# Patient Record
Sex: Female | Born: 1990 | Hispanic: Yes | Marital: Single
Health system: Southern US, Community
[De-identification: ages and names within clinical notes are randomized; demographics above are authoritative.]

## PROBLEM LIST (undated history)

## (undated) DIAGNOSIS — F419 Anxiety disorder, unspecified: Secondary | ICD-10-CM

---

## 2016-06-12 ENCOUNTER — Emergency Department (HOSPITAL_COMMUNITY)
Admission: EM | Admit: 2016-06-12 | Discharge: 2016-06-12 | Disposition: A | Discharge status: Home / Self Care | Payer: Self-pay | Attending: Emergency Medicine | Admitting: Emergency Medicine

## 2016-06-12 ENCOUNTER — Emergency Department (HOSPITAL_COMMUNITY): Payer: Self-pay

## 2016-06-12 ENCOUNTER — Encounter (HOSPITAL_COMMUNITY): Payer: Self-pay | Admitting: Emergency Medicine

## 2016-06-12 DIAGNOSIS — F419 Anxiety disorder, unspecified: Secondary | ICD-10-CM | POA: Insufficient documentation

## 2016-06-12 DIAGNOSIS — R079 Chest pain, unspecified: Secondary | ICD-10-CM | POA: Insufficient documentation

## 2016-06-12 HISTORY — DX: Anxiety disorder, unspecified: F41.9

## 2016-06-12 LAB — I-STAT BETA HCG BLOOD, ED (MC, WL, AP ONLY): I-stat hCG, quantitative: <5 m[IU]/mL (ref <5)

## 2016-06-12 LAB — I-STAT TROPONIN, ED: TROPONIN I, POC: 0 ng/mL (ref 0.00–0.08)

## 2016-06-12 MED ORDER — KETOROLAC TROMETHAMINE 60 MG/2ML IM SOLN
60.0000 mg | Freq: Once | INTRAMUSCULAR | Status: AC
  Administered 2016-06-12: 60 mg via INTRAMUSCULAR
  Filled 2016-06-12: qty 2

## 2016-06-12 NOTE — ED Notes (Signed)
Patient transported to X-ray 

## 2016-06-12 NOTE — Discharge Instructions (Signed)
I think her symptoms today were caused by anxiety. Follow-up with the doctors listed below for further evaluation.  You can take Tylenol or ibuprofen as needed for the pain.  Return to the emergency department for any worsening pain, difficulty breathing, feelings of wanting to hurt or kill yourself or any other worsening or concerning symptoms.   If you do not have a primary care doctor you see regularly, please you the list below. Please call them to arrange for follow-up.    No Primary Care Doctor Call [facility]  [phone] Other agencies that provide inexpensive medical care    [facility]  [phone]    [facility]  [phone]    [facility]  [phone]    [facility]  [phone]    [facility]  [phone]    [facility]  [phone]

## 2016-06-12 NOTE — ED Triage Notes (Signed)
Per EMS pt complaint of panic attack at work; hx of same.

## 2016-06-12 NOTE — ED Provider Notes (Signed)
[facility]-EMERGENCY DEPT Provider Note   CSN: [id number] Arrival date & time: 06/12/16  1729  By signing my name below, I, Ny'Kea Lewis, attest that this documentation has been prepared under the direction and in the presence of Graciella Freer, PA-C. Electronically Signed: Karren Cobble, ED Scribe. 06/13/16. 2:26 AM.  History   Chief Complaint Chief Complaint  Patient presents with  . Anxiety   The history is provided by the patient. A language interpreter was used.   HPI Comments: Heather Sutton is a 26 y.o. female with a PMHx of anxiety, brought in by ambulance, who presents to the Emergency Department complaining of chest tightness, difficulty breathing and paresthesia associated with increased anxiety that started at 4:30 PM. Patient states that symptoms began after she was told some very stressful news. Patient states that when she was initially very upset and proceeded to have symptoms. She states that she had a difficult time catching her breath and then proceeded to have paresthesias. Patient states that she has had similar episodes where she has hyperventilated and had chest tightness after receiving stressful news. On ED arrival patient states that she has had improvement of symptoms. Patient states that the difficulty breathing and the paresthesias have completely resolved. She still has some residual chest status but states that is improved. She currently reports chest tightness as 3/10. Pt states she has recently been lifting heavy boxes but denies any recent chest injuries. Not currently on birth control. No recent surgery, immobilization, recent long travel, hx of pe/dvt. Denies tobbaco or cocaine/marijuana/heroin use.. Denies fever, cough, peripheral edema, shortness of breath, headache,  abdominal pain, nausea, vomiting, dysuria, sucidal or homicial ideations.   interpreter : [id number]  Past Medical History:  Diagnosis Date  . Anxiety     There are no active problems to display  for this patient.   History reviewed. No pertinent surgical history.  OB History    No data available       Home Medications    Prior to Admission medications   Not on File    Family History No family history on file.  Social History Social History  Substance Use Topics  . Smoking status: Not on file  . Smokeless tobacco: Not on file  . Alcohol use Not on file     Allergies   Patient has no known allergies.   Review of Systems Review of Systems  Constitutional: Negative for fever.  Respiratory: Negative for cough and shortness of breath.   Cardiovascular: Positive for chest pain. Negative for leg swelling.  Gastrointestinal: Negative for abdominal pain, nausea and vomiting.  Genitourinary: Negative for dysuria and hematuria.  Neurological: Negative for headaches.  Psychiatric/Behavioral: Negative for suicidal ideas.       Denies homicidal ideations.   All other systems reviewed and are negative.    Physical Exam Updated Vital Signs BP 99/65   Pulse (!) 59   Temp 97.5 F (36.4 C) (Oral)   Resp 10   LMP 06/07/2016   SpO2 98%   Physical Exam  Constitutional: She appears well-developed and well-nourished.  Appears anxious but sitting comfortably on examination table in no acute distress.  HENT:  Head: Normocephalic and atraumatic.  Eyes: Conjunctivae and EOM are normal. Pupils are equal, round, and reactive to light. Right eye exhibits no discharge. Left eye exhibits no discharge. No scleral icterus.  Cardiovascular: Normal rate and regular rhythm.  Exam reveals no gallop and no friction rub.   No murmur heard. Pulses:  Radial pulses are 2+ on the right side, and 2+ on the left side.  Pulmonary/Chest: Effort normal and breath sounds normal. No accessory muscle usage. No respiratory distress. She exhibits tenderness.  Tender with palpation to the left anterior chest wall. Pain is reproduced with adduction/abduction of left upper extremity. No  deformities, no crepitus. No evidence of respiratory distress. Able to speak in full sentences without difficulty.  Musculoskeletal: She exhibits no deformity.  Neurological: She is alert.  Skin: Skin is warm and dry.  Psychiatric: She has a normal mood and affect. Her speech is normal and behavior is normal.  Nursing note and vitals reviewed.   ED Treatments / Results  DIAGNOSTIC STUDIES: Oxygen Saturation is 100% on RA, normal by my interpretation.   COORDINATION OF CARE: 9:01 PM-Discussed next steps with pt. Pt verbalized understanding and is agreeable with the plan.   Labs (all labs ordered are listed, but only abnormal results are displayed) Labs Reviewed  Rosezena Sensor, ED  I-STAT BETA HCG BLOOD, ED ([facility], [facility], [facility] ONLY)    EKG  EKG Interpretation None       Radiology Dg Chest 2 View  Result Date: 06/12/2016 CLINICAL DATA:  Chest pain EXAM: CHEST  2 VIEW COMPARISON:  None. FINDINGS: The heart size and mediastinal contours are within normal limits. Both lungs are clear. The visualized skeletal structures are unremarkable. IMPRESSION: No active cardiopulmonary disease. Electronically Signed   By: Jasmine Pang M.D.   On: 06/12/2016 22:02    Procedures Procedures (including critical care time)  Medications Ordered in ED Medications  ketorolac (TORADOL) injection 60 mg (60 mg Intramuscular Given 06/12/16 2235)     Initial Impression / Assessment and Plan / ED Course  I have reviewed the triage vital signs and the nursing notes.  Pertinent labs & imaging results that were available during my care of the patient were reviewed by me and considered in my medical decision making (see chart for details).     26 year old female with past medical history of anxiety who presents today with increased anxiety and secondary chest tightness, difficulty breathing, paresthesias. Symptoms began after she reported hearing some very stressful news that upset her. On ED arrival she  states the difficulty breathing and paresthesias have resolved. She is still experiencing some mild chest tightness/pain. Patient is afebrile, non-toxic appearing, sitting comfortably on examination table. Symptoms likely result of a panic attack secondary to increased anxiety. Also consider muscular strain given recent history of lifting heavy objects. Low suspicion for ACS pathology given history/physical exam. History/physical exam are not concerning for a PE. Per negative PERC criteria, patient is low risk for a PE and does not require further testing at this time. Low suspicion for acute infectious etiology as cause of her symptoms. Will plan to check chest x-ray, EKG, troponin to evaluate for any acute process. Also check I-stat beta. Analgesics provided in the department.  EKG reviewed. Normal sinus rhythm rate 65. No acute ST elevations. No priors for comparison. Initial troponin negative. Chest x-ray negative for any acute infectious etiology. Patient given Toradol in the department.  Re-evaluation: patient reports improvement in pain after Toradol use. Discussed results with the use of an interpreter. Patient reports that she is feeling better. Patient is stable for discharge at this time. I do not believe her symptoms are caused by any acute ACS abnormality. Symptoms likely secondary to increased anxiety and acute panic attack. Instructed patient follow-up with her primary care doctor in 24-48 hours for further  evaluation. Provided patient with a list of clinic resources to use if he does not have a PCP. Instructed to call them today to arrange follow-up in the next 24-48 hours. Strict return precautions discussed. Patient expresses understanding and agreement to plan.    Final Clinical Impressions(s) / ED Diagnoses   Final diagnoses:  Anxiety  Chest pain, unspecified type    New Prescriptions There are no discharge medications for this patient.  I personally performed the services  described in this documentation, which was scribed in my presence. The recorded information has been reviewed and is accurate.    Maxwell CaulLayden, Lindsey A, PA-C 06/13/16 0230    Charlynne PanderYao, David Hsienta, MD 06/13/16 [phone]

## 2018-12-07 IMAGING — CR DG CHEST 2V
2 series · 2 of 2 positions shown · non-contrast
Comparison: None.

CLINICAL DATA: Chest pain

EXAM:
CHEST  2 VIEW

[w chest pa]
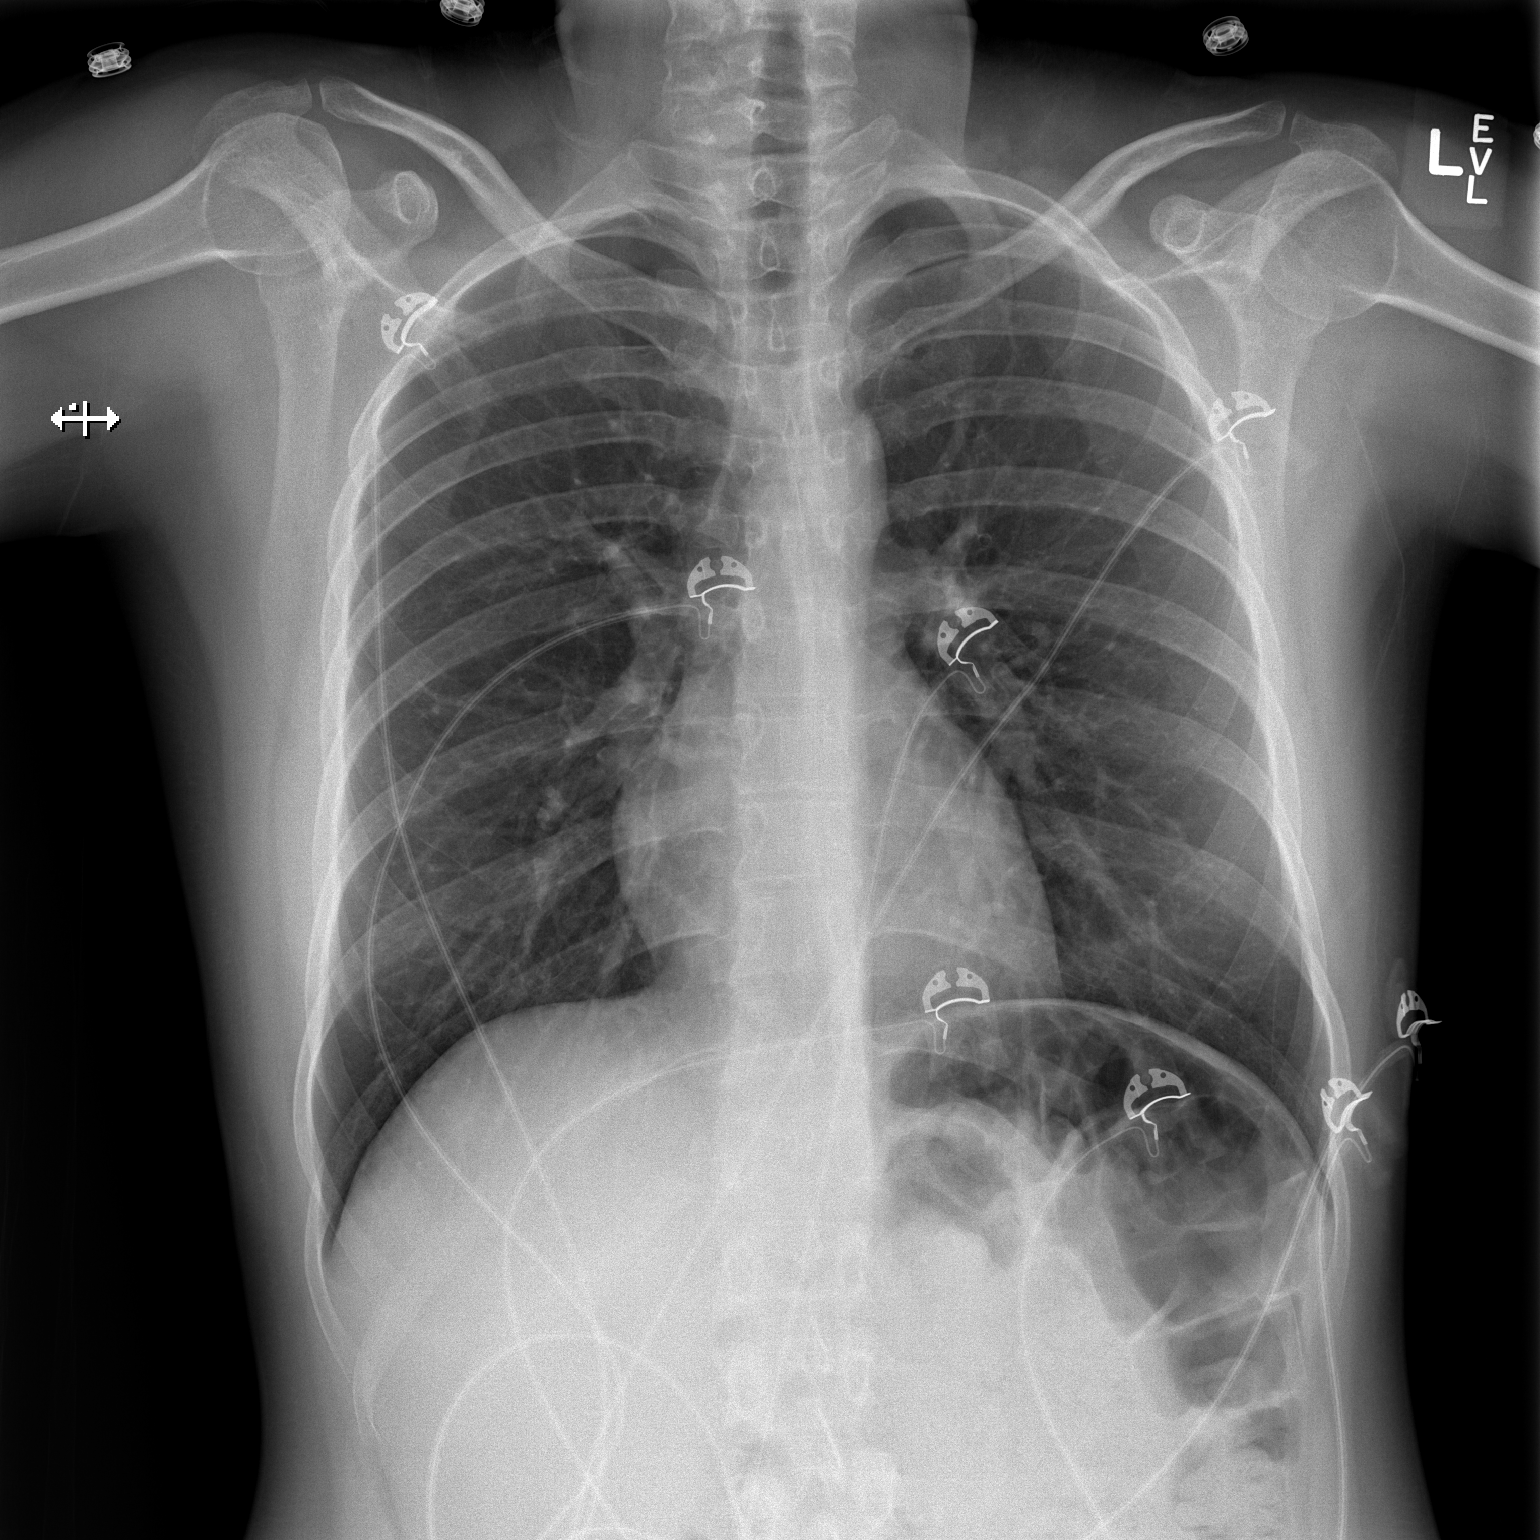

[w chest lat]
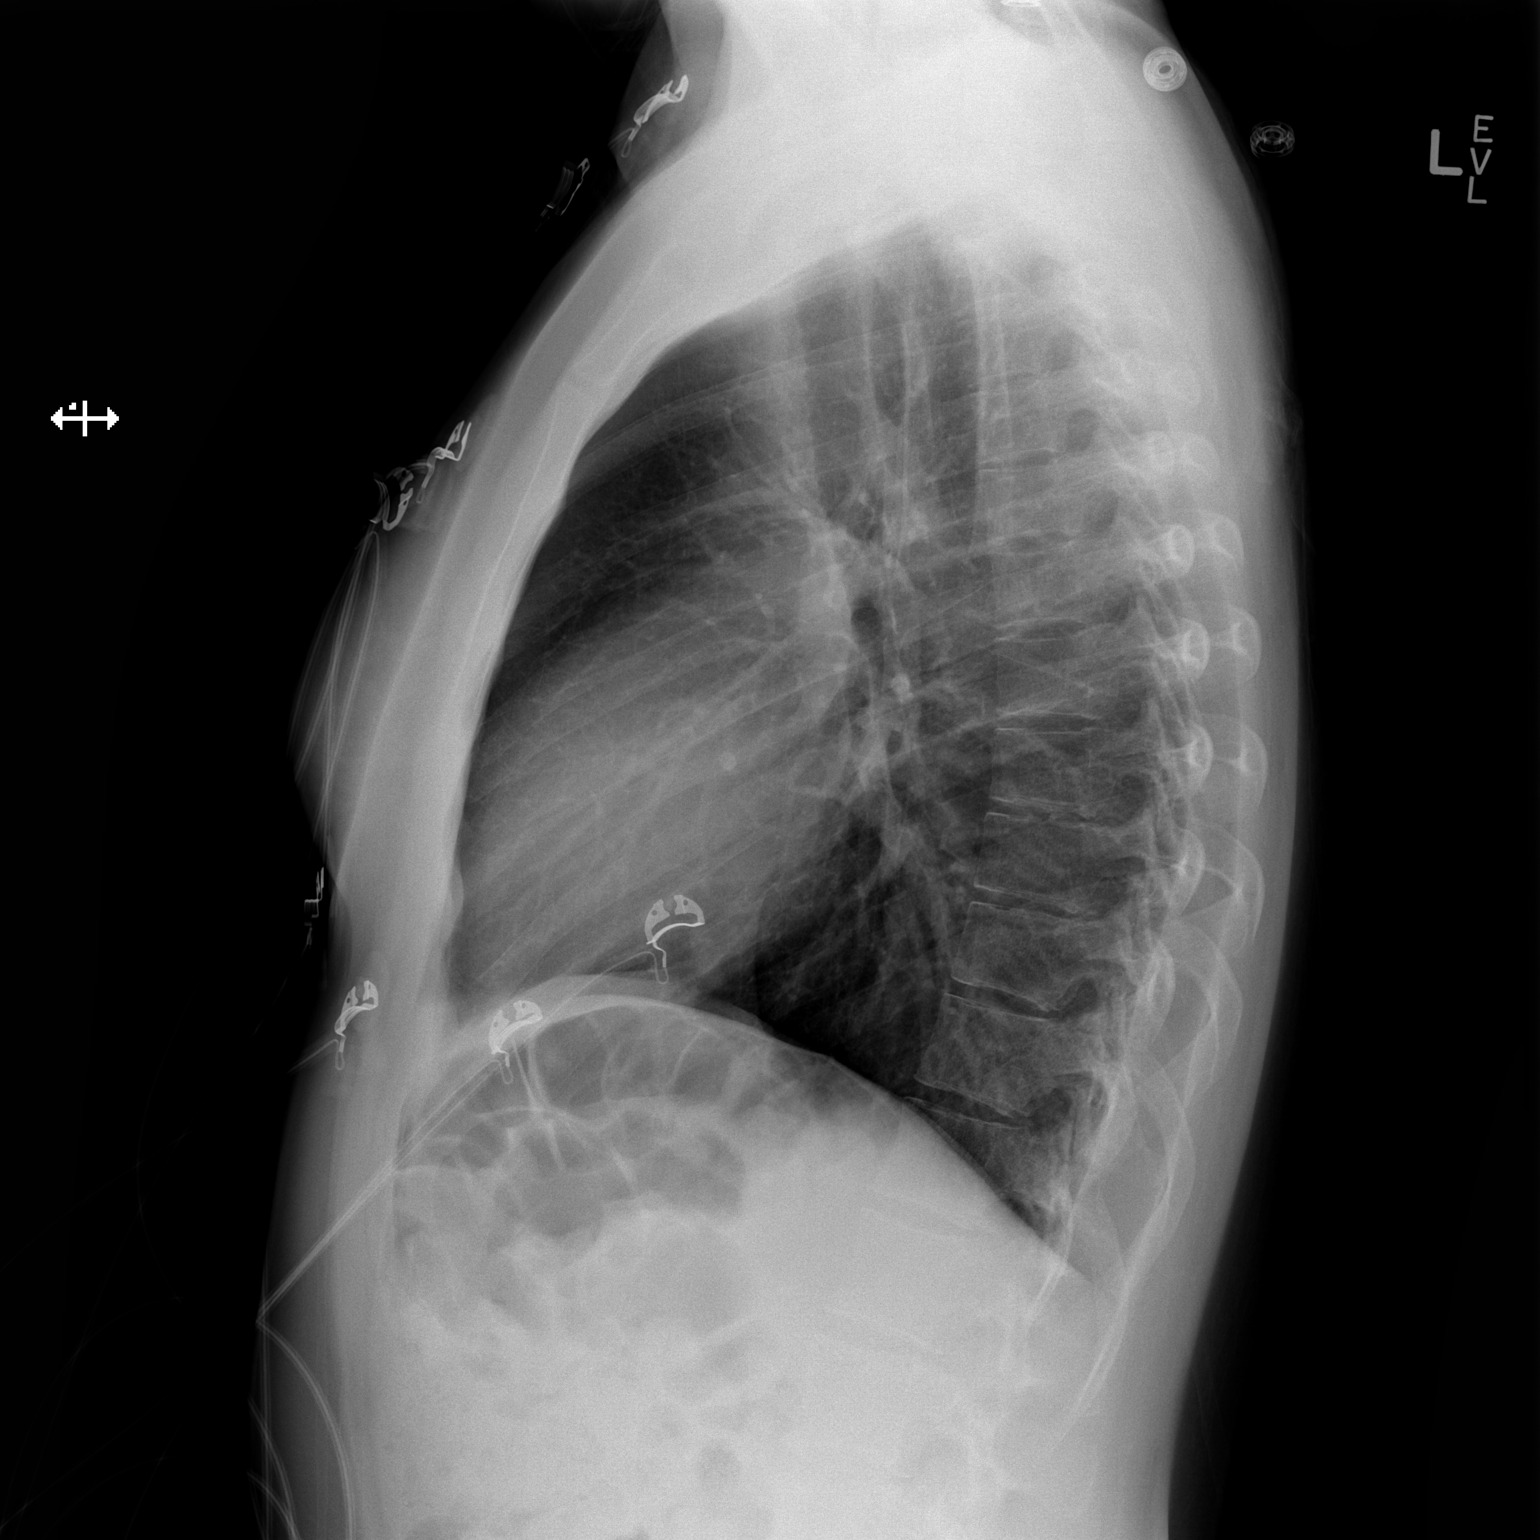

[2 of 2 positions shown; findings below may reference images not displayed]

FINDINGS: The heart size and mediastinal contours are within normal limits.
Both lungs are clear. The visualized skeletal structures are
unremarkable.
IMPRESSION: No active cardiopulmonary disease.
